# Patient Record
Sex: Female | Born: 2005 | Race: White | Hispanic: No | Marital: Single | Smoking: Never smoker
Health system: Southern US, Community
[De-identification: ages and names within clinical notes are randomized; demographics above are authoritative.]

## PROBLEM LIST (undated history)

## (undated) DIAGNOSIS — E611 Iron deficiency: Secondary | ICD-10-CM

## (undated) DIAGNOSIS — E0590 Thyrotoxicosis, unspecified without thyrotoxic crisis or storm: Secondary | ICD-10-CM

## (undated) DIAGNOSIS — D649 Anemia, unspecified: Secondary | ICD-10-CM

## (undated) HISTORY — DX: Anemia, unspecified: D64.9

## (undated) HISTORY — DX: Iron deficiency: E61.1

## (undated) HISTORY — DX: Thyrotoxicosis, unspecified without thyrotoxic crisis or storm: E05.90

---
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
## 2024-02-09 ENCOUNTER — Encounter (INDEPENDENT_AMBULATORY_CARE_PROVIDER_SITE_OTHER): Payer: Self-pay

## 2024-02-22 ENCOUNTER — Encounter (INDEPENDENT_AMBULATORY_CARE_PROVIDER_SITE_OTHER): Payer: Self-pay

## 2024-03-17 ENCOUNTER — Other Ambulatory Visit: Payer: Self-pay | Admitting: Gerontology

## 2024-03-17 ENCOUNTER — Telehealth: Payer: Self-pay | Admitting: Pharmacy Technician

## 2024-03-17 NOTE — Telephone Encounter (Addendum)
 Returned patient phone call in regards to iron infusion. Spoke w/ Alberto Hughes (mother) wanting to know about the iron infusion.Stated that the referral should have been sent last week. I informed her we have not received the referral yet and that I would call and f/u with [facility] ([phone]. Called [facility]) and was told the referral would be faxed today.  Once referral is received, patient will be scheduled as soon as possible. Attempted to call Ivette Marks (mother) to give update but was not able to leave v/m due to mailbox was full   Auth Submission: NO AUTH NEEDED Site of care: Site of care: [facility] Payer: [employer] Id: [id number] Medication & CPT/J Code(s) submitted: Avanell Bob Route of submission (phone, fax, portal):  Phone # Fax # Auth type: Buy/Bill PB Units/visits requested: x2 doses Reference number: [id number] Approval from: 03/17/24 to 07/18/24

## 2024-03-17 NOTE — Progress Notes (Signed)
 Fax for iron referral for patient by NP [facility]. Based on the patient's payor, will order Feraheme 510 mg IV x 2  Janeisha Ryle D. Doniel Maiello, PharmD

## 2024-03-18 ENCOUNTER — Other Ambulatory Visit: Payer: Self-pay

## 2024-03-21 ENCOUNTER — Ambulatory Visit (INDEPENDENT_AMBULATORY_CARE_PROVIDER_SITE_OTHER): Payer: Self-pay

## 2024-03-21 VITALS — BP 92/58 | HR 68 | Temp 98.2°F | Resp 16 | Ht 64.0 in | Wt 116.2 lb

## 2024-03-21 DIAGNOSIS — D508 Other iron deficiency anemias: Secondary | ICD-10-CM

## 2024-03-21 DIAGNOSIS — D509 Iron deficiency anemia, unspecified: Secondary | ICD-10-CM

## 2024-03-21 MED ORDER — SODIUM CHLORIDE 0.9 % IV SOLN
510.0000 mg | Freq: Once | INTRAVENOUS | Status: AC
  Administered 2024-03-21: 510 mg via INTRAVENOUS
  Filled 2024-03-21: qty 17

## 2024-03-21 NOTE — Progress Notes (Signed)
 Diagnosis: Iron Deficiency Anemia  Provider:  Praveen Mannam MD  Procedure: IV Infusion  IV Type: Peripheral, IV Location: R Antecubital  Feraheme (Ferumoxytol ), Dose: 510 mg  Infusion Start Time: 1430  Infusion Stop Time: 1446  Post Infusion IV Care: Observation period completed  Discharge: Condition: Good, Destination: Home . AVS Provided  Performed by:  Rachelle Bue, RN

## 2024-03-23 ENCOUNTER — Ambulatory Visit (INDEPENDENT_AMBULATORY_CARE_PROVIDER_SITE_OTHER): Admitting: Pediatric Endocrinology

## 2024-03-23 ENCOUNTER — Encounter (INDEPENDENT_AMBULATORY_CARE_PROVIDER_SITE_OTHER): Payer: Self-pay | Admitting: Pediatric Endocrinology

## 2024-03-23 ENCOUNTER — Ambulatory Visit (INDEPENDENT_AMBULATORY_CARE_PROVIDER_SITE_OTHER): Payer: Self-pay | Admitting: Pediatric Endocrinology

## 2024-03-23 VITALS — BP 118/70 | HR 80 | Ht 64.61 in | Wt 112.6 lb

## 2024-03-23 DIAGNOSIS — E0500 Thyrotoxicosis with diffuse goiter without thyrotoxic crisis or storm: Secondary | ICD-10-CM

## 2024-03-23 NOTE — Progress Notes (Signed)
 Pediatric Endocrinology Consultation Follow-up Visit Debra [name] Jan 01, 2006 [id number] Debra Dakin, DO   HPI: Debra [name]  is a 18 y.o. female presenting for follow-up of Hypothyroidism [facility]rell Arrant')  she is accompanied to this visit by her mother. Interpreter present throughout the visit: No.  Debra [name] was last seen at [facility] on 03/23/2024.  Since last visit, she states that she is doing well.  She admits that she is missing her methimazole  dosing about 3-4 days per week (takes BID on days she takes it).  She states that she is commonly cold sensitive and constipated.  Denies weight gain/loss, diarrhea, palpitations, anxiety, or other symptoms.  No hair or skin changes.    She is otherwise well.  Has a dance event tonight.    ROS: Greater than 10 systems reviewed with pertinent positives listed in HPI, otherwise neg.  The following portions of the patient's history were reviewed and updated as appropriate:  Past Medical History:  has a past medical history of Anemia and Hyperthyroidism.   Meds: Current Outpatient Medications  Medication Instructions   ferrous sulfate  325 (65 FE) MG tablet Take by mouth.   methimazole  (TAPAZOLE ) 10 MG tablet Take 10 mg twice per day ( start 5 mg in the morning and 10 mg at night)   OVER THE COUNTER MEDICATION Multiple supplements recommended by integrative provider at [facility]    Allergies: No Known Allergies  Surgical History: History reviewed. No pertinent surgical history.  Family History: family history includes Graves' disease in her mother.  Social History: Social History   Social History Narrative   12th grade and is homeschooled. Also taking classes at [employer]   Lives with mom dad   3 pets 2 dogs 1 cat     reports that she has never smoked. She has never been exposed to tobacco smoke. She has never used smokeless tobacco. She reports that she does not drink alcohol and does not use drugs.  Physical Exam:  Vitals:   03/23/24 1552  BP: 118/70   Pulse: 80  Weight: 112 lb 9.6 oz (51.1 kg)  Height: 5' 4.61" (1.641 m)   BP 118/70   Pulse 80   Ht 5' 4.61" (1.641 m)   Wt 112 lb 9.6 oz (51.1 kg)   BMI 18.97 kg/m  Body mass index: body mass index is 18.97 kg/m. Blood pressure %iles are not available for patients who are 18 years or older. 16 %ile (Z= -0.98) based on CDC (Girls, 2-20 Years) BMI-for-age based on BMI available on 03/23/2024.  Wt Readings from Last 3 Encounters:  03/23/24 112 lb 9.6 oz (51.1 kg) (23%, Z= -0.75)*  03/21/24 116 lb 3.2 oz (52.7 kg) (30%, Z= -0.52)*  07/16/23 111 lb (50.3 kg) (22%, Z= -0.77)*   * Growth percentiles are based on CDC (Girls, 2-20 Years) data.   Ht Readings from Last 3 Encounters:  03/23/24 5' 4.61" (1.641 m) (55%, Z= 0.13)*  03/21/24 5\' 4"  (1.626 m) (46%, Z= -0.10)*  03/19/23 5' 5.16" (1.655 m) (65%, Z= 0.37)*   * Growth percentiles are based on CDC (Girls, 2-20 Years) data.   Physical Exam Vitals and nursing note reviewed. Exam conducted with a chaperone present.  Constitutional:      Appearance: Normal appearance. She is normal weight.  HENT:     Head: Normocephalic and atraumatic.     Nose: Nose normal.     Mouth/Throat:     Mouth: Mucous membranes are moist.  Eyes:     Extraocular Movements: Extraocular  movements intact.     Conjunctiva/sclera: Conjunctivae normal.     Comments: No proptosis or exophthalmus   Neck:     Thyroid : Thyromegaly present.     Comments: Minimal thyromegaly Cardiovascular:     Rate and Rhythm: Normal rate and regular rhythm.     Pulses: Normal pulses.     Heart sounds: Normal heart sounds.  Pulmonary:     Effort: Pulmonary effort is normal.     Breath sounds: Normal breath sounds.  Abdominal:     General: Abdomen is flat. Bowel sounds are normal.     Palpations: Abdomen is soft.  Musculoskeletal:     Cervical back: Normal range of motion and neck supple.  Skin:    General: Skin is warm.  Neurological:     General: No focal deficit  present.     Mental Status: She is alert.  Psychiatric:        Mood and Affect: Mood normal.        Behavior: Behavior normal.      Labs: Results for orders placed or performed in visit on 07/16/23  TSH   Collection Time: 07/16/23  9:23 AM  Result Value Ref Range   TSH 3.50 mIU/L  T4, free   Collection Time: 07/16/23  9:23 AM  Result Value Ref Range   Free T4 1.2 0.8 - 1.4 ng/dL  T4   Collection Time: 07/16/23  9:23 AM  Result Value Ref Range   T4, Total 8.6 5.3 - 11.7 mcg/dL  T3   Collection Time: 07/16/23  9:23 AM  Result Value Ref Range   T3, Total 91 86 - 192 ng/dL  Fe+TIBC+Fer   Collection Time: 07/16/23  9:23 AM  Result Value Ref Range   Iron 23 (L) 27 - 164 mcg/dL   TIBC 454 098 - 119 mcg/dL (calc)   %SAT 5 (L) 15 - 45 % (calc)   Ferritin 6 6 - 67 ng/mL  CBC with Differential/Platelet   Collection Time: 07/16/23  9:23 AM  Result Value Ref Range   WBC 5.9 4.5 - 13.0 Thousand/uL   RBC 4.70 3.80 - 5.10 Million/uL   Hemoglobin 10.9 (L) 11.5 - 15.3 g/dL   HCT 14.7 82.9 - 56.2 %   MCV 77.2 (L) 78.0 - 98.0 fL   MCH 23.2 (L) 25.0 - 35.0 pg   MCHC 30.0 (L) 31.0 - 36.0 g/dL   RDW 13.0 (H) 86.5 - 78.4 %   Platelets 348 140 - 400 Thousand/uL   MPV 11.1 7.5 - 12.5 fL   Neutro Abs 2,348 1,800 - 8,000 cells/uL   Lymphs Abs 2,513 1,200 - 5,200 cells/uL   Absolute Monocytes 513 200 - 900 cells/uL   Eosinophils Absolute 466 15 - 500 cells/uL   Basophils Absolute 59 0 - 200 cells/uL   Neutrophils Relative % 39.8 %   Total Lymphocyte 42.6 %   Monocytes Relative 8.7 %   Eosinophils Relative 7.9 %   Basophils Relative 1.0 %    Imaging: No results found for this or any previous visit.   Assessment/Plan: Debra [name] has a history of Graves' disease and is mildly symptomatic, although her compliance with medical therapy is not good.  Given this, and her constellation of symptoms, it is suggestive of the possibility she is in remission.  We may be able to wean or do a trial off  methimazole  therapy.  Additionally, her thyromegaly is minimal, her BP and HR are normal.    No change on  methimazole  today.  Plan to obtain thyroid  studies tomorrow or Friday.  Mother to call our office if no results to her within one week of lab draw.  We discussed transition to adult care after deciding on next steps in her management.    Follow-up:   Return in about 3 months (around 06/23/2024).   Medical decision-making:  I have personally spent 45 minutes involved in face-to-face and non-face-to-face activities for this patient on the day of the visit. Professional time spent includes the following activities, in addition to those noted in the documentation: preparation time/chart review, ordering of medications/tests/procedures, obtaining and/or reviewing separately obtained history, counseling and educating the patient/family/caregiver, performing a medically appropriate examination and/or evaluation, referring and communicating with other health care professionals for care coordination, and documentation in the EHR.  Thank you for the opportunity to participate in the care of your patient. Please do not hesitate to contact me should you have any questions regarding the assessment or treatment plan.   Sincerely,   Ulanda Gambles, MD

## 2024-03-26 LAB — T4, FREE: Free T4: 1.16 ng/dL (ref 0.93–1.60)

## 2024-03-26 LAB — TSH: TSH: 2.49 u[IU]/mL (ref 0.450–4.500)

## 2024-03-26 LAB — T3: T3, Total: 111 ng/dL (ref 71–180)

## 2024-03-29 ENCOUNTER — Ambulatory Visit (INDEPENDENT_AMBULATORY_CARE_PROVIDER_SITE_OTHER): Payer: Self-pay

## 2024-03-29 VITALS — BP 100/65 | HR 75 | Temp 98.1°F | Resp 18 | Ht 64.0 in | Wt 115.6 lb

## 2024-03-29 DIAGNOSIS — D509 Iron deficiency anemia, unspecified: Secondary | ICD-10-CM

## 2024-03-29 DIAGNOSIS — D508 Other iron deficiency anemias: Secondary | ICD-10-CM

## 2024-03-29 MED ORDER — SODIUM CHLORIDE 0.9 % IV SOLN
510.0000 mg | Freq: Once | INTRAVENOUS | Status: AC
  Administered 2024-03-29: 510 mg via INTRAVENOUS
  Filled 2024-03-29: qty 17

## 2024-03-29 NOTE — Progress Notes (Signed)
 Diagnosis: Iron Deficiency Anemia  Provider:  Praveen Mannam MD  Procedure: IV Infusion  IV Type: Peripheral, IV Location: L Antecubital  Feraheme (Ferumoxytol ), Dose: 510 mg  Infusion Start Time: 1554  Infusion Stop Time: 1615  Post Infusion IV Care: Patient declined observation and Peripheral IV Discontinued  Discharge: Condition: Good, Destination: Home . AVS Declined  Performed by:  Kevona Lupinacci, RN

## 2024-04-27 ENCOUNTER — Telehealth: Payer: Self-pay | Admitting: Pharmacy Technician

## 2024-04-27 NOTE — Telephone Encounter (Signed)
 Received message from Asberry (mom) in regards to B12 referral. I spoke with Rutha from [facility] and was informed B12 inj will be give there at [facility]. Called and left detailed v/m with mom Lundy) to f/u with [facility] to schedule appt.

## 2024-04-28 ENCOUNTER — Telehealth: Payer: Self-pay | Admitting: Gerontology

## 2024-04-28 ENCOUNTER — Telehealth: Payer: Self-pay

## 2024-04-28 NOTE — Telephone Encounter (Signed)
 Spoke with Debra [name] @ [facility] and confirmed patient to get Feraheme 510 mg IV x 2 (based on insurance) and a dose of B12.  Areona Homer D. Cheryl Stabenow, PharmD

## 2024-04-28 NOTE — Telephone Encounter (Signed)
 Auth Submission: NO AUTH NEEDED Site of care: Site of care: [facility] Payer: [place] [employer] medicaid Medication & CPT/J Code(s) submitted: Feraheme (ferumoxytol ) [id number] Diagnosis Code:  Route of submission (phone, fax, portal):  Phone # Fax # Auth type: Buy/Bill PB Units/visits requested: 510mg  x 2 doses Reference number:  Approval from: 04/28/24 to 08/28/24

## 2024-05-01 ENCOUNTER — Ambulatory Visit (INDEPENDENT_AMBULATORY_CARE_PROVIDER_SITE_OTHER): Payer: Self-pay | Admitting: Pediatric Endocrinology

## 2024-05-01 DIAGNOSIS — E0500 Thyrotoxicosis with diffuse goiter without thyrotoxic crisis or storm: Secondary | ICD-10-CM

## 2024-05-01 NOTE — Progress Notes (Signed)
 Relayed thyroid  results to mother.  Guenevere only takes her methimazole  about 3 days per week and only once per day on those days.  She has had Graves diagnosis for multiple years.  We will stop the methimazole  altogether and recheck the thyroid  studies in 4-5 weeks.  It appears she may be in remission and the methimazole  may be contributing to her anemia.   If she rebounds and needs treatment, will consider removing thyroid .

## 2024-05-04 ENCOUNTER — Ambulatory Visit

## 2024-05-04 VITALS — BP 98/62 | HR 55 | Temp 97.9°F | Resp 14 | Ht 65.0 in | Wt 117.2 lb

## 2024-05-04 DIAGNOSIS — E538 Deficiency of other specified B group vitamins: Secondary | ICD-10-CM | POA: Diagnosis not present

## 2024-05-04 DIAGNOSIS — D508 Other iron deficiency anemias: Secondary | ICD-10-CM

## 2024-05-04 DIAGNOSIS — D509 Iron deficiency anemia, unspecified: Secondary | ICD-10-CM

## 2024-05-04 MED ORDER — CYANOCOBALAMIN 1000 MCG/ML IJ SOLN
1000.0000 ug | Freq: Once | INTRAMUSCULAR | Status: AC
  Administered 2024-05-04: 1000 ug via INTRAMUSCULAR
  Filled 2024-05-04: qty 1

## 2024-05-04 MED ORDER — SODIUM CHLORIDE 0.9 % IV SOLN
510.0000 mg | Freq: Once | INTRAVENOUS | Status: AC
  Administered 2024-05-04: 510 mg via INTRAVENOUS
  Filled 2024-05-04: qty 17

## 2024-05-04 NOTE — Progress Notes (Signed)
 Diagnosis: Iron Deficiency Anemia  Provider:  Praveen Mannam MD  Procedure: IV Push  IV Type: Peripheral, IV Location: R Antecubital  Feraheme (Ferumoxytol ), Dose: 510 mg  Post Infusion IV Care: Observation period completed  Discharge: Condition: Good, Destination: Home . AVS Declined  Performed by:  Eleanor DELENA Bloch, RN       Procedure: Injection  Vitamin B12, Dose: , Site: intramuscular, Number of injections: 1  Injection Site(s): Right deltoid  Post Care: Observation period completed  Discharge: Condition: Good, Destination: Home . AVS Declined  Performed by:  Eleanor DELENA Bloch, RN

## 2024-05-11 ENCOUNTER — Ambulatory Visit

## 2024-05-17 ENCOUNTER — Ambulatory Visit

## 2024-05-17 VITALS — BP 102/63 | HR 66 | Temp 98.7°F | Resp 16 | Ht 65.0 in | Wt 116.6 lb

## 2024-05-17 DIAGNOSIS — D508 Other iron deficiency anemias: Secondary | ICD-10-CM

## 2024-05-17 DIAGNOSIS — D509 Iron deficiency anemia, unspecified: Secondary | ICD-10-CM

## 2024-05-17 MED ORDER — SODIUM CHLORIDE 0.9 % IV SOLN
510.0000 mg | Freq: Once | INTRAVENOUS | Status: AC
Start: 2024-05-17 — End: 2024-05-17
  Administered 2024-05-17: 510 mg via INTRAVENOUS
  Filled 2024-05-17: qty 17

## 2024-05-17 NOTE — Progress Notes (Signed)
 Diagnosis: Iron Deficiency Anemia  Provider:  Praveen Mannam MD  Procedure: IV Infusion  IV Type: Peripheral, IV Location: R Antecubital  Banana Bag, Venofer (Iron Sucrose), Dose: 200 mg  Infusion Start Time: 1523  Infusion Stop Time: 1539  Post Infusion IV Care: Patient declined observation and Peripheral IV Discontinued  Discharge: Condition: Good, Destination: Home . AVS Declined  Performed by:  Samule Life, RN

## 2024-06-10 ENCOUNTER — Ambulatory Visit (INDEPENDENT_AMBULATORY_CARE_PROVIDER_SITE_OTHER): Payer: Self-pay | Admitting: Pediatric Endocrinology

## 2024-08-23 ENCOUNTER — Other Ambulatory Visit: Payer: Self-pay | Admitting: Physical Medicine and Rehabilitation

## 2024-08-23 ENCOUNTER — Encounter: Payer: Self-pay | Admitting: Physical Medicine and Rehabilitation

## 2024-08-23 DIAGNOSIS — Z808 Family history of malignant neoplasm of other organs or systems: Secondary | ICD-10-CM

## 2024-08-30 ENCOUNTER — Ambulatory Visit (INDEPENDENT_AMBULATORY_CARE_PROVIDER_SITE_OTHER): Admitting: Pediatrics

## 2024-08-30 ENCOUNTER — Encounter (INDEPENDENT_AMBULATORY_CARE_PROVIDER_SITE_OTHER): Payer: Self-pay | Admitting: Pediatrics

## 2024-08-30 VITALS — BP 110/70 | HR 80 | Wt 113.4 lb

## 2024-08-30 DIAGNOSIS — R7689 Other specified abnormal immunological findings in serum: Secondary | ICD-10-CM | POA: Insufficient documentation

## 2024-08-30 DIAGNOSIS — Z7187 Encounter for pediatric-to-adult transition counseling: Secondary | ICD-10-CM | POA: Insufficient documentation

## 2024-08-30 DIAGNOSIS — E0789 Other specified disorders of thyroid: Secondary | ICD-10-CM | POA: Diagnosis not present

## 2024-08-30 NOTE — Assessment & Plan Note (Signed)
-  clinically euthyroid -August TSH and free thyroxine levels normal -We reviewed that we knew she had all 3 positive antibodies and the labs from August are for the antibodies associated with thyroid  disease. We discussed that you can have antibodies without thyroid  disease. She is at risk of developing hyper and hypothyroidism in the future -She is having no signs or symptoms of thyroid  cancer and obtaining a thyroid  ultrasound is not standard of care in pediatrics, so I could not recommend for or against this. We reviewed that she is now an adult and her thyroid  function can be followed annually or sooner if any signs/symptoms of thyroid  disease by her PCP or she can see adult endocrinology. -They will look into [facility] for their other concerns -Overall they were reassured and she has transitioned to adult care.

## 2024-08-30 NOTE — Progress Notes (Signed)
 Pediatric Endocrinology Consultation Follow-up Visit Debra [name] Jun 04, 2006 [id number] Prentiss Cantor, DO   HPI: Henya  is a 18 y.o. female presenting for follow-up of Hyperthyroidism.  she is accompanied to this visit by her mother. Interpreter present throughout the visit: No.  Lynise was last seen at [facility] on 03/23/2024.  Since last visit, Zuleica Seith has been off of methimazole . She has had fatigue treated with iron infusion and B12. There has been no heat/cold intolerance, constipation/diarrhea, rapid heart rate, tremor, mood changes, dry skin, brittle hair/hair loss, nor changes in menses.   Recently saw functional medicine provider as they feel that she metabolizes medications differently. Thyroid  ultrasound reportedly recommended for concern of thyroid  cancer since she had two other positive antibodies. No symptoms currently for this.   ROS: Greater than 10 systems reviewed with pertinent positives listed in HPI, otherwise neg. The following portions of the patient's history were reviewed and updated as appropriate:  Past Medical History:  has a past medical history of Anemia and Hyperthyroidism.  Meds: Current Outpatient Medications  Medication Instructions   ferrous sulfate  325 (65 FE) MG tablet Take by mouth.   methimazole  (TAPAZOLE ) 10 MG tablet Take 10 mg twice per day ( start 5 mg in the morning and 10 mg at night)   OVER THE COUNTER MEDICATION Multiple supplements recommended by integrative provider at [facility]    Allergies: No Known Allergies  Surgical History: History reviewed. No pertinent surgical history.  Family History: family history includes Graves' disease in her mother.  Social History: Social History   Social History Narrative   [employer]   Lives with mom   3 pets 2 dogs 1 cat     reports that she has never smoked. She has never been exposed to tobacco smoke. She has never used smokeless tobacco. She reports that she does not drink alcohol and does not use  drugs.  Physical Exam:  Vitals:   08/30/24 1048  BP: 110/70  Pulse: 80  Weight: 113 lb 6.4 oz (51.4 kg)   BP 110/70 (BP Location: Right Arm, Patient Position: Sitting, Cuff Size: Small)   Pulse 80   Wt 113 lb 6.4 oz (51.4 kg)   LMP 08/27/2024   BMI 18.87 kg/m  Body mass index: body mass index is 18.87 kg/m. Blood pressure %iles are not available for patients who are 18 years or older. 14 %ile (Z= -1.06) based on CDC (Girls, 2-20 Years) BMI-for-age data using weight from 08/30/2024 and height from 05/17/2024.  Wt Readings from Last 3 Encounters:  08/30/24 113 lb 6.4 oz (51.4 kg) (23%, Z= -0.75)*  05/17/24 116 lb 9.6 oz (52.9 kg) (30%, Z= -0.52)*  05/04/24 117 lb 3.2 oz (53.2 kg) (32%, Z= -0.48)*   * Growth percentiles are based on CDC (Girls, 2-20 Years) data.   Ht Readings from Last 3 Encounters:  05/17/24 5' 5 (1.651 m) (61%, Z= 0.29)*  05/04/24 5' 5 (1.651 m) (61%, Z= 0.29)*  03/29/24 5' 4 (1.626 m) (46%, Z= -0.10)*   * Growth percentiles are based on CDC (Girls, 2-20 Years) data.   Physical Exam Vitals reviewed.  Constitutional:      Appearance: Normal appearance. She is not toxic-appearing.  HENT:     Head: Normocephalic and atraumatic.     Nose: Nose normal.     Mouth/Throat:     Mouth: Mucous membranes are moist.  Eyes:     Extraocular Movements: Extraocular movements intact.  Neck:     Comments: No goiter,  no nodules Cardiovascular:     Pulses: Normal pulses.  Pulmonary:     Effort: Pulmonary effort is normal. No respiratory distress.  Abdominal:     General: There is no distension.  Musculoskeletal:        General: Normal range of motion.     Cervical back: Normal range of motion and neck supple.  Skin:    Capillary Refill: Capillary refill takes less than 2 seconds.  Neurological:     General: No focal deficit present.     Mental Status: She is alert.     Gait: Gait normal.     Comments: No tremor, colder hands  Psychiatric:        Mood and  Affect: Mood normal.      Labs: 06/03/2024: TSH 2.99, Free T4 1.13, TPO Ab 166 and Th Ab 1.1 Results for orders placed or performed in visit on 03/23/24  T4, free   Collection Time: 03/25/24  3:54 PM  Result Value Ref Range   Free T4 1.16 0.93 - 1.60 ng/dL  TSH   Collection Time: 03/25/24  3:54 PM  Result Value Ref Range   TSH 2.490 0.450 - 4.500 uIU/mL  T3   Collection Time: 03/25/24  3:54 PM  Result Value Ref Range   T3, Total 111 71 - 180 ng/dL    Latest Reference Range & Units 08/07/20 14:57  TSH mIU/L <0.01 (L)  Triiodothyronine (T3) 86 - 192 ng/dL 616 (H)  U5,Qmzz(Ipmzru) 0.8 - 1.4 ng/dL 2.8 (H)  Thyroxine (T4) 5.3 - 11.7 mcg/dL 16.6 (H)  (L): Data is abnormally low (H): Data is abnormally high  Latest Reference Range & Units Most Recent  Thyroglobulin Ab < or = 1 IU/mL 3 (H) 08/07/20 14:57  Thyroperoxidase Ab SerPl-aCnc <9 IU/mL 30 (H) 08/07/20 14:57  Thyroid  stimulating immunoglobulin  443 08/07/20 14:57  (H): Data is abnormally high !: Data is abnormal Rpt: View report in Results Review for more information Imaging: No results found for this or any previous visit.   Assessment/Plan: Auda was seen today for graves' disease.  Complex endocrine disorder of thyroid  Overview: Initially diagnosed with Graves disease as she had 08/07/2020: TSH <0.01, FT4 2.8, TT3 383, TSI+ 443, THAb+ 3, TPO Ab+30. She was initially treated with methimazole , but was noted at appointment in May 2025 to be missing multiple doses, so trial off was started then, and she has done well off of medication.  Lauraine Dustman established care with [facility] End[facility]ology 08/07/2020 and transitioned care to me on 08/30/2024.   Assessment & Plan: -clinically euthyroid -August TSH and free thyroxine levels normal -We reviewed that we knew she had all 3 positive antibodies and the labs from August are for the antibodies associated with thyroid  disease. We discussed that you can  have antibodies without thyroid  disease. She is at risk of developing hyper and hypothyroidism in the future -She is having no signs or symptoms of thyroid  cancer and obtaining a thyroid  ultrasound is not standard of care in pediatrics, so I could not recommend for or against this. We reviewed that she is now an adult and her thyroid  function can be followed annually or sooner if any signs/symptoms of thyroid  disease by her PCP or she can see adult endocrinology. -They will look into [facility] for their other concerns -Overall they were reassured and she has transitioned to adult care.   Counseling for transition from pediatric to adult care provider    Patient Instructions  Follow-up:   Return for Transitioned to adult care.  Medical decision-making:  I have personally spent 42 minutes involved in face-to-face and non-face-to-face activities for this patient on the day of the visit. Professional time spent includes the following activities, in addition to those noted in the documentation: preparation time/chart review, ordering of medications/tests/procedures, obtaining and/or reviewing separately obtained history, counseling and educating the patient/family/caregiver, performing a medically appropriate examination and/or evaluation, referring and communicating with other health care professionals for care coordination,  and documentation in the EHR.  Thank you for the opportunity to participate in the care of your patient. Please do not hesitate to contact me should you have any questions regarding the assessment or treatment plan.   Sincerely,   Marce Rucks, MD

## 2024-08-31 ENCOUNTER — Encounter (INDEPENDENT_AMBULATORY_CARE_PROVIDER_SITE_OTHER): Payer: Self-pay

## 2024-09-06 ENCOUNTER — Telehealth (INDEPENDENT_AMBULATORY_CARE_PROVIDER_SITE_OTHER): Payer: Self-pay | Admitting: Pediatrics

## 2024-09-06 DIAGNOSIS — R7689 Other specified abnormal immunological findings in serum: Secondary | ICD-10-CM

## 2024-09-06 DIAGNOSIS — E0789 Other specified disorders of thyroid: Secondary | ICD-10-CM

## 2024-09-06 NOTE — Telephone Encounter (Signed)
 Referral placed, please let family know. TY

## 2024-09-06 NOTE — Addendum Note (Signed)
 Addended by: MARGARETE MARCE RAMAN on: 09/06/2024 04:07 PM   Modules accepted: Orders

## 2024-09-06 NOTE — Telephone Encounter (Signed)
 [facility] call ID: [id number]   Name of who is calling: Danamarie Minami   Caller's Relationship to Patient: Mom   Best contact number: [phone]  Provider they see: Dr. Margarete  Reason for call:  caller states that her daughter's endocrinologist issued a referral to [facility], but they said they need it in writing.    PRESCRIPTION REFILL ONLY  Name of prescription:  Pharmacy:

## 2024-09-07 NOTE — Telephone Encounter (Signed)
 Attemtped to call mom to update, left HIPAA approved VM to check mychart or call back.

## 2024-09-08 ENCOUNTER — Telehealth (INDEPENDENT_AMBULATORY_CARE_PROVIDER_SITE_OTHER): Payer: Self-pay | Admitting: Pediatrics

## 2024-09-08 NOTE — Telephone Encounter (Signed)
 Pt sent a mychart message about trying to find a PCP, does meehan have any recommendations for the pt.

## 2024-09-20 ENCOUNTER — Encounter (INDEPENDENT_AMBULATORY_CARE_PROVIDER_SITE_OTHER): Payer: Self-pay | Admitting: Pediatrics

## 2024-10-17 ENCOUNTER — Ambulatory Visit: Admitting: Medical

## 2024-10-31 ENCOUNTER — Ambulatory Visit (INDEPENDENT_AMBULATORY_CARE_PROVIDER_SITE_OTHER): Admitting: Medical

## 2024-10-31 VITALS — BP 112/76 | HR 102 | Temp 98.6°F | Ht 65.0 in | Wt 109.8 lb

## 2024-10-31 DIAGNOSIS — F419 Anxiety disorder, unspecified: Secondary | ICD-10-CM | POA: Diagnosis not present

## 2024-10-31 DIAGNOSIS — F909 Attention-deficit hyperactivity disorder, unspecified type: Secondary | ICD-10-CM | POA: Diagnosis not present

## 2024-10-31 DIAGNOSIS — D649 Anemia, unspecified: Secondary | ICD-10-CM

## 2024-10-31 DIAGNOSIS — E611 Iron deficiency: Secondary | ICD-10-CM

## 2024-10-31 DIAGNOSIS — E46 Unspecified protein-calorie malnutrition: Secondary | ICD-10-CM | POA: Diagnosis not present

## 2024-10-31 DIAGNOSIS — R7989 Other specified abnormal findings of blood chemistry: Secondary | ICD-10-CM

## 2024-10-31 MED ORDER — VITAMIN D (CHOLECALCIFEROL) 10 MCG (400 UNIT) PO TABS: 1.0000 | ORAL_TABLET | Freq: Every day | ORAL | 2 refills | Status: AC

## 2024-10-31 MED ORDER — PRENATAL 27-1 MG PO TABS: 1.0000 | ORAL_TABLET | Freq: Every day | ORAL | 2 refills | Status: AC

## 2024-10-31 NOTE — Patient Instructions (Addendum)
 Counseling Services  [facility] Rosina Lunger, counseling [address] [place], [place] [address] [phone]   [facility] [phone] [address],  [place], [place] [address]   Dr. Glean Major, [place] [phone] [place], [place] [address]   [facility] [address] [place], [place] [address] [phone]    [facility] Crisis Line and Main phone number [phone]  [facility]  [phone]  [facility] Out[facility] [phone]  Adult Crisis Center [phone]   Other Resources  If you are experiencing a mental health crisis or an emergency, please call 911 or go to the nearest emergency department.  [facility]   [phone] [facility]  [phone] [facility]   [phone]  Suicide Hotline 1-800-Suicide (916)477-7124)  National Suicide Prevention Lifeline 226-735-5242  404-276-2333)  Domestic Violence, Rape/Crisis - [facility] [place] 663-726-2726  [employer] Violence Hotline 1-800-799-SAFE 949-083-7017)  To report Child or Elder Abuse, please call: [facility] Police Department  [phone] [facility] Department  [phone]  Teen Crisis line [phone] or [phone]

## 2024-10-31 NOTE — Progress Notes (Signed)
 "   Name: Debra [name]   Date of Visit: 10/31/2024   Date of last visit with me: Visit date not found   CHIEF COMPLAINT:  Chief Complaint  Patient presents with   New Patient (Initial Visit)    New pt get established. Referral to [facility] to figure out why she doesn't respond to hydroxyzine , adderall, hydocodone and tylenol.        HPI:  Discussed the use of AI scribe software for clinical note transcription with the patient, who gave verbal consent to proceed.  History of Present Illness    Debra [name] is a 18 year old female who presents with concerns about medication non-responsiveness and nutritional deficiencies.  Accompanied by her female cousin today  She has a history of anxiety and ADHD, with previous diagnoses and treatment including counseling for anxiety around the age of [age] to [age]. Her anxiety has improved but persists occasionally. She has not been on ADHD medication for about a year and a half, having previously tried Adderall without much effect.  She has a history of hyperthyroidism, specifically Graves' disease, and was treated with methimazole . Her thyroid  levels have normalized, and she is no longer on medication. Her last thyroid  test was in May 2025.  She has experienced anemia in the past and has undergone iron infusions, with the last one in September 2025. She had blood work done about two weeks ago.  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 She reports consuming approximately 1000 calories a day, which she acknowledges is insufficient. She typically eats one big meal a day and sometimes snacks. Her periods have become more regular recently.  After her wisdom teeth removal, she experienced significant pain and her body did not respond well to pain medications. She mentions non-responsiveness to medications such as hydroxyzine , Adderall, and Tylenol.  No current  significant symptoms or concerns. Sleep has improved but is not optimal.  Not currently seeing psychiatry or counseling.  No other aggravating or relieving factors. No other complaint.   Past Medical History:  Diagnosis Date   Anemia    Phreesia 08/07/2020   Hyperthyroidism    Iron deficiency    Thyroid  antibody positive 08/30/2024   Medications Ordered Prior to Encounter[1]   ROS as in subjective   Objective: BP 112/76   Pulse (!) 102   Temp 98.6 F (37 C)   Ht 5' 5 (1.651 m)   Wt 109 lb 12.8 oz (49.8 kg)   SpO2 98%   BMI 18.27 kg/m   General appearence: alert, no distress, WD/WN,  Neck: supple, no lymphadenopathy, no thyromegaly, no masses Heart: RRR, normal S1, S2, no murmurs Lungs: CTA bilaterally, no wheezes, rhonchi, or rales Pulses: 2+ symmetric, upper and lower extremities, normal cap refill    Assessment and plan: Encounter Diagnoses  Name Primary?   Anemia, unspecified type Yes   Protein-calorie malnutrition, unspecified severity    Anxiety    Attention deficit hyperactivity disorder (ADHD), unspecified ADHD type    Iron deficiency    Abnormal thyroid  blood test      Protein-calorie malnutrition Insufficient caloric intake relative to high physical activity. Symptoms include fatigue and potential anemia. - Increase caloric intake to at least 2500 calories per day. - Track food intake using a calorie tracking app. - Aim for 80-100 grams of protein daily. - Consume a variety of nutrients including fruits, vegetables, meat, beans, nuts, and  dairy. - Take a prenatal vitamin with extra iron. - Take 600-800 units of vitamin D daily.  History of anemia and iron deficiency with prior iron infusions -counseled on diet, nutritionist -recent labs this month were stable for iron, ferritin and CBC  Graves' disease, currently euthyroid Previously treated with methimazole , currently euthyroid. - Recheck thyroid  labs in one month. -labs from 03/2024  reviewed, normal at that time  Anxiety disorder Previously treated with counseling. Symptoms improved but may still be present. - Consider seeing a counselor for anxiety management. - Provided a list of local counselors.  Attention-deficit hyperactivity disorder Previously treated with Adderall. Current symptoms suggest possible need for medication. Discussed potential for gene site testing but emphasized trial of different medications first. - Consider trial of different medications for ADHD before gene sight testing. - Provided a list of local counselors for potential therapy.  General health maintenance Discussed importance of balanced diet and adequate caloric intake for overall health and performance as a dance major. - Encouraged balanced diet with adequate caloric intake. - Recommended prenatal vitamin and vitamin D supplementation.   Next visit consider Genesight + analgesic testing and /or referral to [facility].  Denetria was seen today for new patient (initial visit).  Diagnoses and all orders for this visit:  Anemia, unspecified type  Protein-calorie malnutrition, unspecified severity  Anxiety  Attention deficit hyperactivity disorder (ADHD), unspecified ADHD type  Iron deficiency  Abnormal thyroid  blood test  Other orders -     Prenatal 27-1 MG TABS; Take 1 tablet by mouth daily. -     Vitamin D, Cholecalciferol, 10 MCG (400 UNIT) TABS; Take 1 tablet by mouth daily.    F/u 47mo       [1]  No current outpatient medications on file prior to visit.   No current facility-administered medications on file prior to visit.   "

## 2024-11-01 ENCOUNTER — Telehealth: Payer: Self-pay | Admitting: Internal Medicine

## 2024-11-01 NOTE — Telephone Encounter (Signed)
Left message for pt to call back.   Will also send a mychart message.

## 2024-11-01 NOTE — Telephone Encounter (Signed)
-----   Message from Alm Gent, PA-C sent at 11/01/2024  8:04 AM EST ----- Call patient.  Ask her to call her insurance to see if they cover Genesight + Analgesic.   Make sure we have the appropriate collection materials and transport material for this test here.  We may do this test next visit.  Also, I spoke to the clinic yesterday she was referring too and I don't think they are really set up for evaluating the concerns she has regarding medications and response. ----- Message ----- From: Haldeman-Englert, Chad, MD Sent: 10/31/2024   5:03 PM EST To: Alm GORMAN Gent, PA-C  Hi Shane,  Thanks for reaching out, and am sorry to hear that your patient is not responding the way she would like to her medication. Given that we are not prescribers of these types of medications, our clinic does not do the pharmacogenomic testing. In addition, I don't recommend genetic testing for ADHD medications as the evidence is good enough to warrant this testing (despite what the genetic testing company would tell you).   That being said, I can help you order the testing for her if you wanted to do it. Would have to get some of the logistics worked out but can be done. I am out of the office this week but we can touch base next week if you would like to talk about it.  Hope this helps and let me know if you have other questions.  Chad ----- Message ----- From: Gent Alm GORMAN DEVONNA Sent: 10/31/2024   4:44 PM EST To: Chad Haldeman-Englert, MD  Hello Dr. Delinda I saw this new patient today inquiring about referral to your clinic.   She was inquiring about testing for why she hasn't responded well to certain medications for ADD, anxiety, or narcotics.  I just met her, she wasn't sure what medications she has tried, and hasn't been on very many medications. She is also nutrient deficiency given her amount of exercise.     We discussed genesight testing, but I wasn't sure if she should come see  you all yet or not.    I plan to see her back in 30 days after working on nutrition, appropriate calorie intake, initiating vitamins and checking some baseline labs next visit.   Thanks for your feedback Ludie

## 2024-11-01 NOTE — Progress Notes (Signed)
"  See telephone call  "

## 2024-12-02 ENCOUNTER — Ambulatory Visit: Admitting: Medical
